# Patient Record
Sex: Male | Born: 1965 | Hispanic: Yes | Marital: Single | Smoking: Never smoker
Health system: Southern US, Community
[De-identification: ages and names within clinical notes are randomized; demographics above are authoritative.]

## PROBLEM LIST (undated history)

## (undated) HISTORY — PX: FINGER SURGERY: SHX640

---

## 2008-10-12 ENCOUNTER — Encounter: Admission: RE | Admit: 2008-10-12 | Discharge: 2008-10-12 | Payer: Self-pay | Admitting: Neurology

## 2009-12-15 IMAGING — CT CT HEAD WO/W CM
1 of 2 series · 13 of 30 positions shown, 17 images · IV contrast (75CC OMNI 300)
Comparison: None

CLINICAL DATA: Vertigo.  Headaches.  Sinusitis.

CT HEAD WITHOUT AND WITH CONTRAST
TECHNIQUE: Contiguous axial images were obtained from the base of
the skull through the vertex without and with intravenous contrast.
Contrast: 75 ml Xmnipaque-TBB IV

[Series 32: 3d filtered head · axial · 0.49mm/px · z∈[+3,+135]mm · 13 of 32 slices shown, 17 images]
[im 3/32  brain]
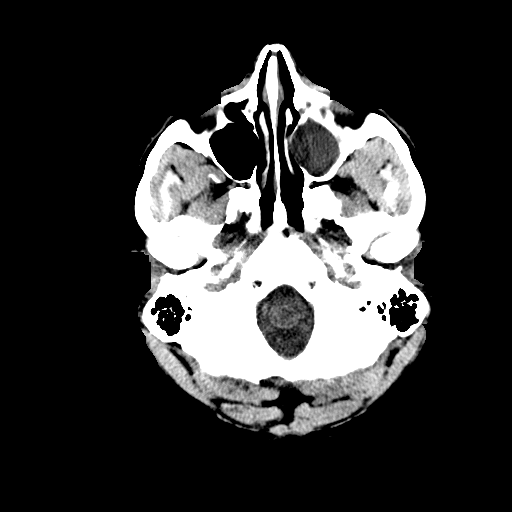
[im 3/32  bone]
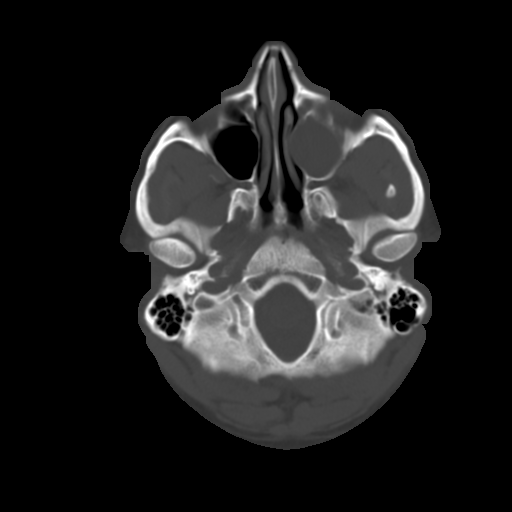
[im 5/32  brain]
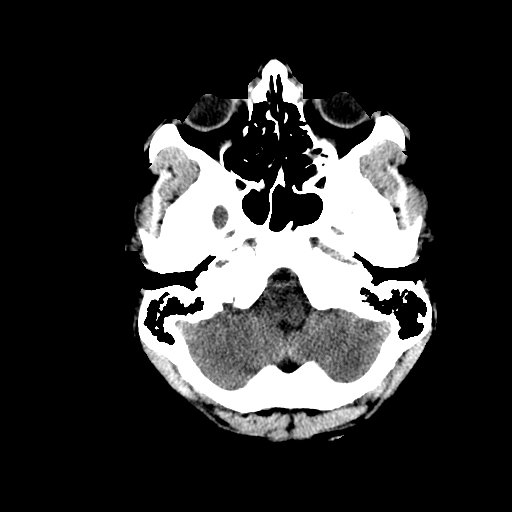
[im 7/32  brain]
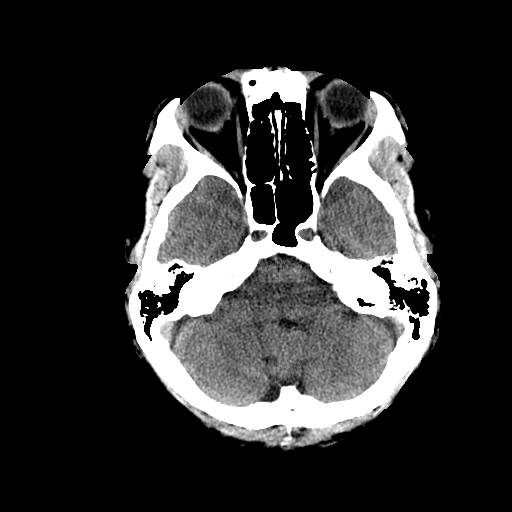
[im 9/32  brain]
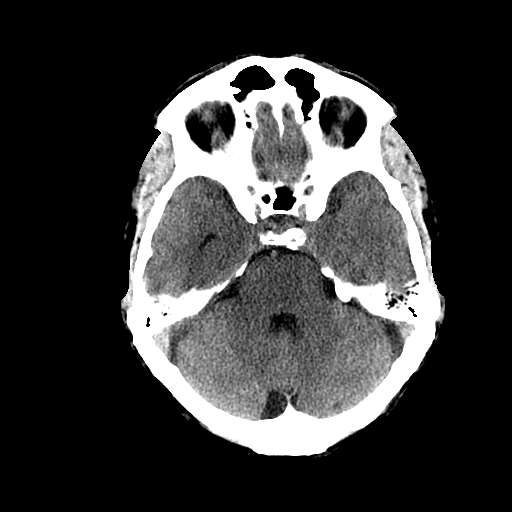
[im 12/32  brain]
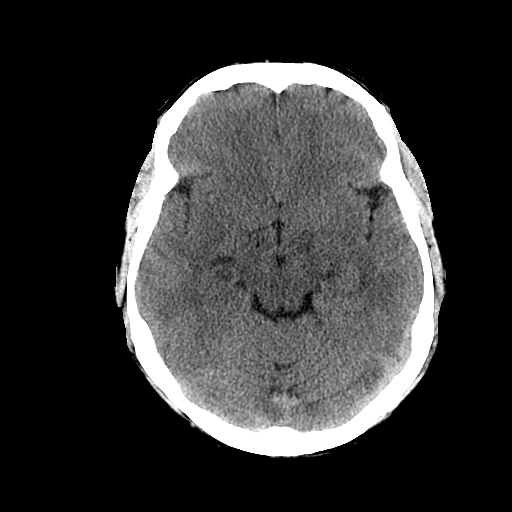
[im 12/32  bone]
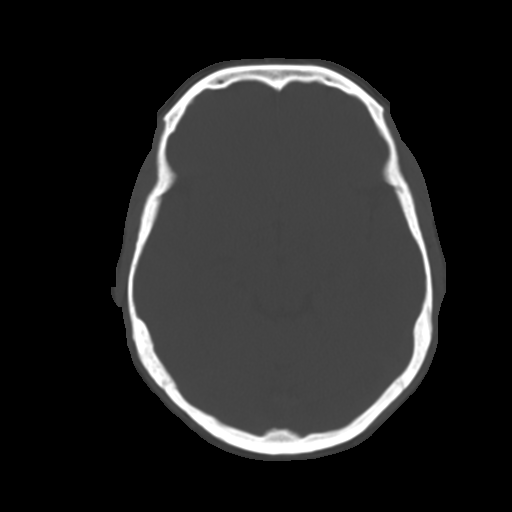
[im 14/32  brain]
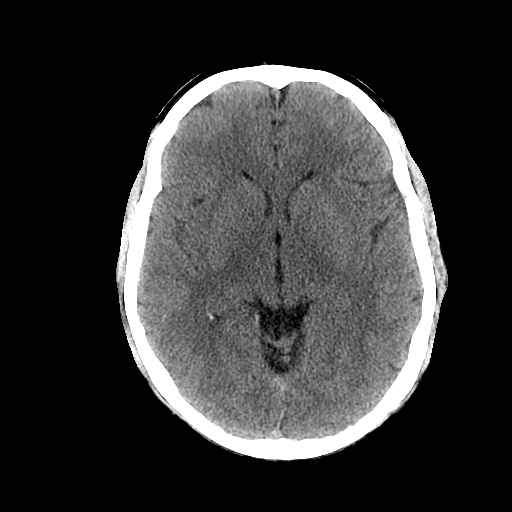
[im 16/32  brain]
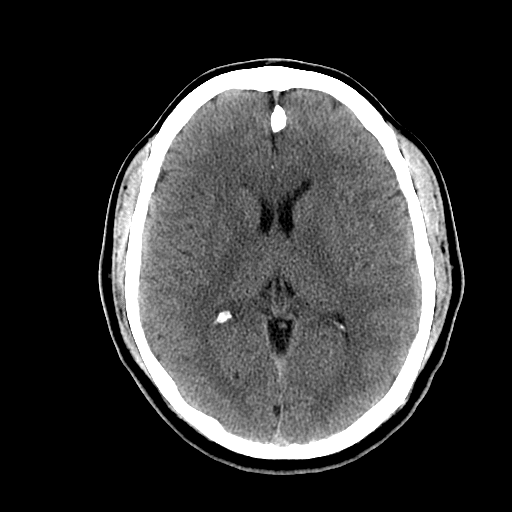
[im 18/32  brain]
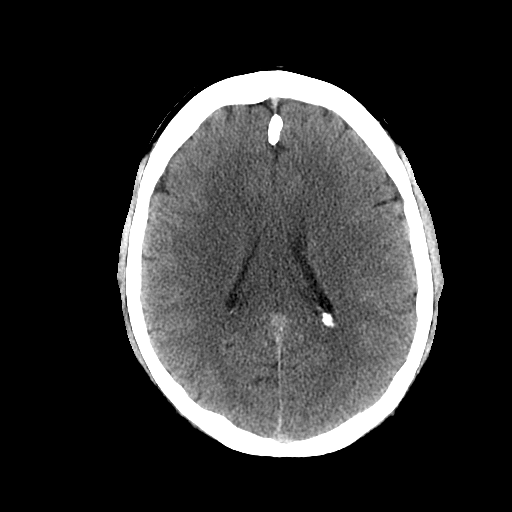
[im 20/32  brain]
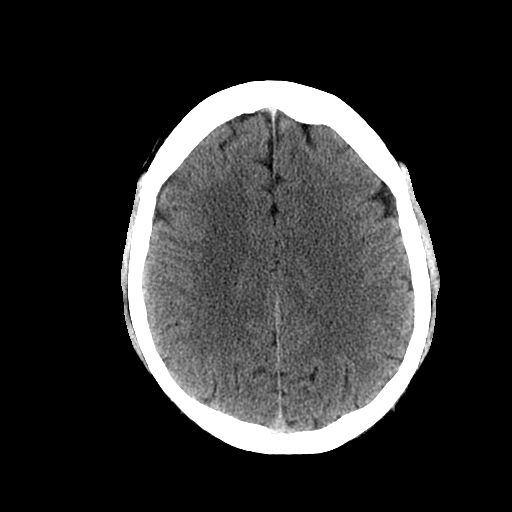
[im 20/32  bone]
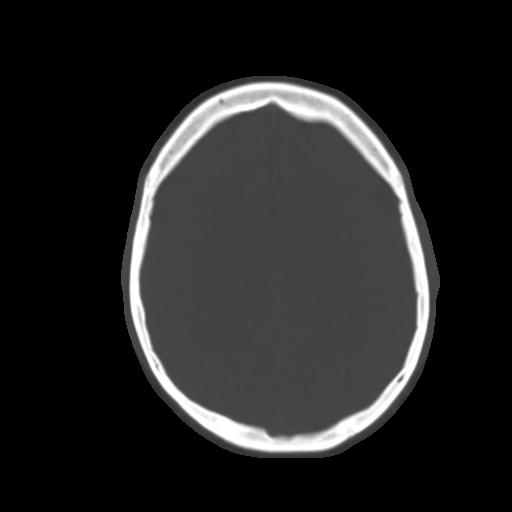
[im 23/32  brain]
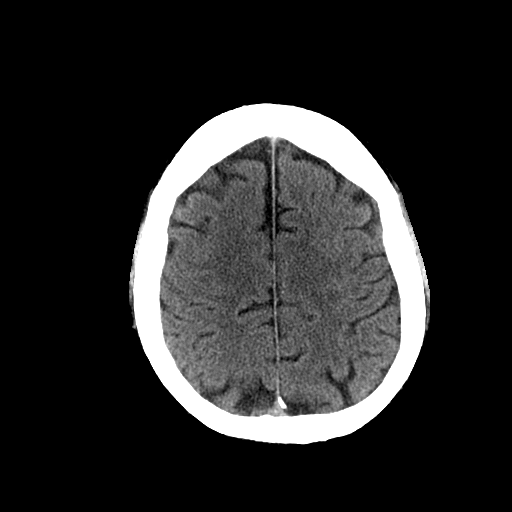
[im 25/32  brain]
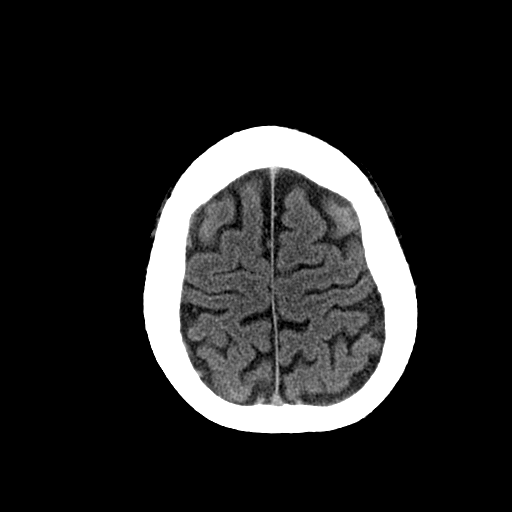
[im 27/32  brain]
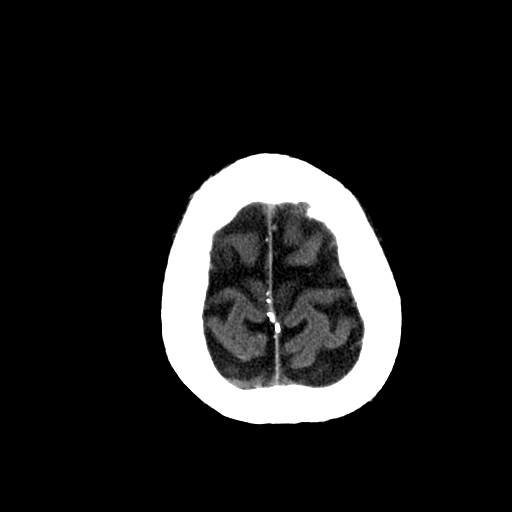
[im 29/32  brain]
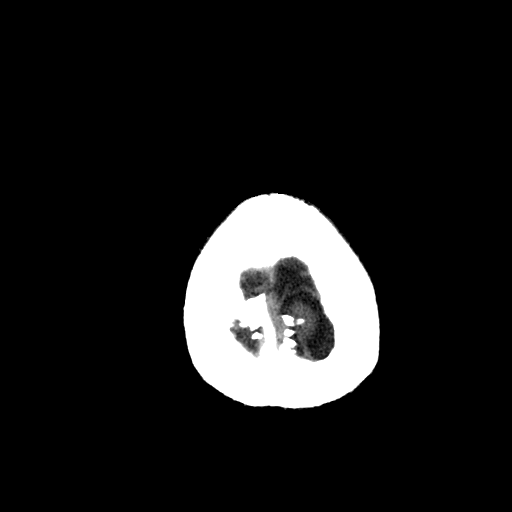
[im 29/32  bone]
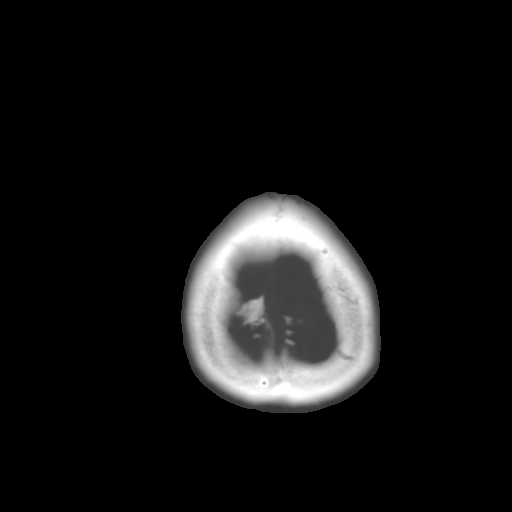

[13 of 30 positions shown; findings below may reference images not displayed]

FINDINGS: No acute intracranial abnormality.  Negative for mass
lesion, hemorrhage, infarction, or hydrocephalus.  No abnormal
contrast enhancement.  There is total opacification of the left
maxillary sinus.
IMPRESSION: No acute intracranial abnormality.  Left maxillary sinusitis.

## 2012-04-21 ENCOUNTER — Other Ambulatory Visit: Payer: Self-pay | Admitting: *Deleted

## 2012-04-21 DIAGNOSIS — H547 Unspecified visual loss: Secondary | ICD-10-CM

## 2012-04-26 ENCOUNTER — Other Ambulatory Visit: Payer: Self-pay

## 2022-11-29 ENCOUNTER — Emergency Department (HOSPITAL_BASED_OUTPATIENT_CLINIC_OR_DEPARTMENT_OTHER): Payer: BC Managed Care – PPO

## 2022-11-29 ENCOUNTER — Other Ambulatory Visit: Payer: Self-pay

## 2022-11-29 ENCOUNTER — Emergency Department (HOSPITAL_BASED_OUTPATIENT_CLINIC_OR_DEPARTMENT_OTHER)
Admission: EM | Admit: 2022-11-29 | Discharge: 2022-11-29 | Disposition: A | Discharge status: Home / Self Care | Payer: BC Managed Care – PPO | Attending: Emergency Medicine | Admitting: Emergency Medicine

## 2022-11-29 ENCOUNTER — Encounter (HOSPITAL_BASED_OUTPATIENT_CLINIC_OR_DEPARTMENT_OTHER): Payer: Self-pay | Admitting: Emergency Medicine

## 2022-11-29 DIAGNOSIS — R112 Nausea with vomiting, unspecified: Secondary | ICD-10-CM | POA: Insufficient documentation

## 2022-11-29 DIAGNOSIS — R1013 Epigastric pain: Secondary | ICD-10-CM

## 2022-11-29 LAB — COMPREHENSIVE METABOLIC PANEL
ALT: 30 U/L (ref 0–44)
AST: 33 U/L (ref 15–41)
Albumin: 4.3 g/dL (ref 3.5–5.0)
Alkaline Phosphatase: 91 U/L (ref 38–126)
Anion gap: 10 (ref 5–15)
BUN: 14 mg/dL (ref 6–20)
CO2: 24 mmol/L (ref 22–32)
Calcium: 8.6 mg/dL — ABNORMAL LOW (ref 8.9–10.3)
Chloride: 104 mmol/L (ref 98–111)
Creatinine, Ser: 1.02 mg/dL (ref 0.61–1.24)
GFR, Estimated: >60 mL/min (ref >60)
Glucose, Bld: 137 mg/dL — ABNORMAL HIGH (ref 70–99)
Potassium: 3.7 mmol/L (ref 3.5–5.1)
Sodium: 138 mmol/L (ref 135–145)
Total Bilirubin: 1.2 mg/dL (ref 0.3–1.2)
Total Protein: 7.8 g/dL (ref 6.5–8.1)

## 2022-11-29 LAB — CBC WITH DIFFERENTIAL/PLATELET
Abs Immature Granulocytes: 0.05 10*3/uL (ref 0.00–0.07)
Basophils Absolute: 0 10*3/uL (ref 0.0–0.1)
Basophils Relative: 0 %
Eosinophils Absolute: 0 10*3/uL (ref 0.0–0.5)
Eosinophils Relative: 0 %
HCT: 47.9 % (ref 39.0–52.0)
Hemoglobin: 16.3 g/dL (ref 13.0–17.0)
Immature Granulocytes: 0 %
Lymphocytes Relative: 11 %
Lymphs Abs: 1.4 10*3/uL (ref 0.7–4.0)
MCH: 30.6 pg (ref 26.0–34.0)
MCHC: 34 g/dL (ref 30.0–36.0)
MCV: 89.9 fL (ref 80.0–100.0)
Monocytes Absolute: 0.4 10*3/uL (ref 0.1–1.0)
Monocytes Relative: 3 %
Neutro Abs: 10.6 10*3/uL — ABNORMAL HIGH (ref 1.7–7.7)
Neutrophils Relative %: 86 %
Platelets: 321 10*3/uL (ref 150–400)
RBC: 5.33 MIL/uL (ref 4.22–5.81)
RDW: 12.7 % (ref 11.5–15.5)
WBC: 12.5 10*3/uL — ABNORMAL HIGH (ref 4.0–10.5)
nRBC: 0 % (ref 0.0–0.2)

## 2022-11-29 LAB — TROPONIN I (HIGH SENSITIVITY)
Troponin I (High Sensitivity): 3 ng/L (ref <18)
Troponin I (High Sensitivity): 3 ng/L (ref <18)

## 2022-11-29 LAB — LIPASE, BLOOD: Lipase: 23 U/L (ref 11–51)

## 2022-11-29 MED ORDER — SODIUM CHLORIDE 0.9 % IV BOLUS
1000.0000 mL | Freq: Once | INTRAVENOUS | Status: AC
  Administered 2022-11-29: 1000 mL via INTRAVENOUS

## 2022-11-29 MED ORDER — PANTOPRAZOLE SODIUM 40 MG PO TBEC: 40.0000 mg | DELAYED_RELEASE_TABLET | Freq: Every day | ORAL | 0 refills | Status: AC

## 2022-11-29 MED ORDER — LIDOCAINE VISCOUS HCL 2 % MT SOLN
15.0000 mL | Freq: Once | OROMUCOSAL | Status: AC
  Administered 2022-11-29: 15 mL via ORAL
  Filled 2022-11-29: qty 15

## 2022-11-29 MED ORDER — HYDROMORPHONE HCL 1 MG/ML IJ SOLN
1.0000 mg | Freq: Once | INTRAMUSCULAR | Status: AC
  Administered 2022-11-29: 1 mg via INTRAVENOUS
  Filled 2022-11-29: qty 1

## 2022-11-29 MED ORDER — ONDANSETRON HCL 4 MG/2ML IJ SOLN
4.0000 mg | Freq: Once | INTRAMUSCULAR | Status: AC
  Administered 2022-11-29: 4 mg via INTRAVENOUS
  Filled 2022-11-29: qty 2

## 2022-11-29 MED ORDER — ALUM & MAG HYDROXIDE-SIMETH 200-200-20 MG/5ML PO SUSP
30.0000 mL | Freq: Once | ORAL | Status: AC
  Administered 2022-11-29: 30 mL via ORAL
  Filled 2022-11-29: qty 30

## 2022-11-29 MED ORDER — IOHEXOL 300 MG/ML  SOLN
100.0000 mL | Freq: Once | INTRAMUSCULAR | Status: AC | PRN
  Administered 2022-11-29: 100 mL via INTRAVENOUS

## 2022-11-29 MED ORDER — PANTOPRAZOLE SODIUM 40 MG IV SOLR
40.0000 mg | Freq: Once | INTRAVENOUS | Status: AC
  Administered 2022-11-29: 40 mg via INTRAVENOUS
  Filled 2022-11-29: qty 10

## 2022-11-29 NOTE — Discharge Instructions (Addendum)
Your evaluated in the emergency department today for abdominal pain, nausea, vomiting.  Your imaging today was overall indicated gallstones, without evidence of obstruction or acute infection.  Is important to follow-up with a gastroenterologist for further evaluation and continued medical management.  You have been provided the contact information for [facility] gastroenterology, please call tomorrow morning to schedule an appointment within the next 1 week.  A prescription has been provided for you by the name of Protonix.  Take daily for the next 3 weeks, or until instructed otherwise by gastroenterology.  You have also been provided the contact information for to local primary care offices.  If you do not have a PCP, please call to schedule an appointment to establish care and for cohesive management.  Return to the ED for new or worsening symptoms as discussed.

## 2022-11-29 NOTE — ED Provider Notes (Signed)
[facility] [place] [facility] [facility] Provider Note   CSN: [id number] Arrival date & time: 11/29/22  1040     History  Chief Complaint  Patient presents with   Abdominal Pain    Blake Simpson is a 57 y.o. male with Hx of migraines presenting to the ED with acute upper abdominal pain.  Started around 2:30 AM today, woke patient up out of his sleep.  Started mildly and has continued to increase until about 530, stayed consistent since then.  Rated as 7/10, described as sharp and feeling like he is being punched.  No radiation through to the back or from the site.  Endorses nausea and 1-2 episodes of vomiting, no blood in the vomit.  Denies constipation or diarrhea.  No recent injury.  Reports having symptoms like this about 6 months ago, was told this was likely acid reflux.  However patient states today's pain is much worse than then.  Social alcohol use about once a month.  Admits to chronic NSAID use, 3-4 times per week ibuprofen, and 3-4 times per week Excedrin for migraines.  The history is provided by the patient and medical records.  Abdominal Pain Associated symptoms: nausea and vomiting      Home Medications Prior to Admission medications   Medication Sig Start Date End Date Taking? Authorizing Provider  pantoprazole (PROTONIX) 40 MG tablet Take 1 tablet (40 mg total) by mouth daily for 21 days. 11/29/22 12/20/22 Yes Prince Rome, PA-C      Allergies    Patient has no known allergies.    Review of Systems   Review of Systems  Gastrointestinal:  Positive for abdominal pain, nausea and vomiting.    Physical Exam Updated Vital Signs BP 123/74 (BP Location: Right Arm)   Pulse 66   Temp 98.6 F (37 C) (Oral)   Resp 20   Ht 5\' 10"  (1.778 m)   Wt 115.7 kg   SpO2 99%   BMI 36.59 kg/m  Physical Exam Vitals and nursing note reviewed.  Constitutional:      General: He is not in acute distress.    Appearance: He is well-developed. He is  not ill-appearing, toxic-appearing or diaphoretic.     Comments: Uncomfortable appearing  HENT:     Head: Normocephalic and atraumatic.  Eyes:     Conjunctiva/sclera: Conjunctivae normal.  Cardiovascular:     Rate and Rhythm: Normal rate and regular rhythm.     Heart sounds: Normal heart sounds. No murmur heard. Pulmonary:     Effort: Pulmonary effort is normal. No respiratory distress.     Breath sounds: Normal breath sounds. No wheezing.     Comments: CTAB, able to communicate without difficulty, without increased respiratory effort.  Equal chest rise.  Adequate air movement Chest:     Chest wall: No tenderness.  Abdominal:     General: Abdomen is protuberant. Bowel sounds are normal. There is distension (Mild).     Palpations: Abdomen is soft. There is no shifting dullness, mass or pulsatile mass.     Tenderness: There is abdominal tenderness in the right upper quadrant, epigastric area and left upper quadrant. There is no right CVA tenderness, left CVA tenderness or guarding. Positive signs include Murphy's sign. Negative signs include McBurney's sign.  Musculoskeletal:        General: No swelling.     Cervical back: Neck supple.  Skin:    General: Skin is warm and dry.     Capillary Refill:  Capillary refill takes less than 2 seconds.     Coloration: Skin is not cyanotic, jaundiced or pale.     Comments: Cool  Neurological:     Mental Status: He is alert and oriented to person, place, and time.     GCS: GCS eye subscore is 4. GCS verbal subscore is 5. GCS motor subscore is 6.  Psychiatric:        Mood and Affect: Mood normal.     ED Results / Procedures / Treatments   Labs (all labs ordered are listed, but only abnormal results are displayed) Labs Reviewed  CBC WITH DIFFERENTIAL/PLATELET - Abnormal; Notable for the following components:      Result Value   WBC 12.5 (*)    Neutro Abs 10.6 (*)    All other components within normal limits  COMPREHENSIVE METABOLIC PANEL -  Abnormal; Notable for the following components:   Glucose, Bld 137 (*)    Calcium 8.6 (*)    All other components within normal limits  LIPASE, BLOOD  TROPONIN I (HIGH SENSITIVITY)  TROPONIN I (HIGH SENSITIVITY)    EKG EKG Interpretation  Date/Time:  Sunday November 29 2022 10:59:02 EST Ventricular Rate:  65 PR Interval:  144 QRS Duration: 103 QT Interval:  426 QTC Calculation: 443 R Axis:   85 Text Interpretation: Sinus rhythm No old tracing to compare Confirmed by Meridee Score [phone]) on 11/29/2022 11:05:28 AM  Radiology US Abdomen Limited RUQ (LIVER/GB)  Result Date: 11/29/2022 CLINICAL DATA:  Increased abdominal pain. EXAM: ULTRASOUND ABDOMEN LIMITED RIGHT UPPER QUADRANT COMPARISON:  CT of the abdomen 11/29/2022 FINDINGS: Gallbladder: No gallbladder wall thickening visualized. No sonographic Murphy sign noted by sonographer. There is cholelithiasis. Common bile duct: Diameter: 6.4 mm Liver: No focal lesion identified. Within normal limits in parenchymal echogenicity. Portal vein is patent on color Doppler imaging with normal direction of blood flow towards the liver. Other: None. IMPRESSION: Cholelithiasis without evidence of acute cholecystitis. Electronically Signed   By: Ted Mcalpine M.D.   On: 11/29/2022 15:09   CT Abdomen Pelvis W Contrast  Result Date: 11/29/2022 CLINICAL DATA:  Upper abdominal pain with vomiting. EXAM: CT ABDOMEN AND PELVIS WITH CONTRAST TECHNIQUE: Multidetector CT imaging of the abdomen and pelvis was performed using the standard protocol following bolus administration of intravenous contrast. RADIATION DOSE REDUCTION: This exam was performed according to the departmental dose-optimization program which includes automated exposure control, adjustment of the mA and/or kV according to patient size and/or use of iterative reconstruction technique. CONTRAST:  OMNIPAQUE IOHEXOL 300 MG/ML  SOLN COMPARISON:  None Available. FINDINGS: Lower chest:  Unremarkable. Hepatobiliary: No suspicious focal abnormality within the liver parenchyma. Multiple noncalcified cholesterol stones are seen in the gallbladder. No intrahepatic or extrahepatic biliary dilation. Pancreas: No focal mass lesion. No dilatation of the main duct. No intraparenchymal cyst. No peripancreatic edema. Spleen: No splenomegaly. No focal mass lesion. Adrenals/Urinary Tract: No adrenal nodule or mass. Kidneys unremarkable. Tiny hypoattenuating lesions in both kidneys are too small to characterize but most likely benign. No followup imaging is recommended. No evidence for hydroureter. The urinary bladder appears normal for the degree of distention. Stomach/Bowel: Stomach is unremarkable. No gastric wall thickening. No evidence of outlet obstruction. Duodenum is normally positioned as is the ligament of Treitz. No small bowel wall thickening. No small bowel dilatation. The terminal ileum is normal. The appendix is not well visualized, but there is no edema or inflammation in the region of the cecum. No gross colonic mass. No  colonic wall thickening. Diverticular changes are noted in the left colon without evidence of diverticulitis. Vascular/Lymphatic: No abdominal aortic aneurysm. No abdominal aortic atherosclerotic calcification. There is no gastrohepatic or hepatoduodenal ligament lymphadenopathy. No retroperitoneal or mesenteric lymphadenopathy. No pelvic sidewall lymphadenopathy. Reproductive: The prostate gland and seminal vesicles are unremarkable. Other: No intraperitoneal free fluid. Musculoskeletal: No worrisome lytic or sclerotic osseous abnormality. IMPRESSION: 1. No acute findings in the abdomen or pelvis. 2. Uncomplicated cholelithiasis. 3. Left colonic diverticulosis without diverticulitis. Electronically Signed   By: Misty Stanley M.D.   On: 11/29/2022 12:55   DG Chest 2 View  Result Date: 11/29/2022 CLINICAL DATA:  Epigastric pain EXAM: CHEST - 2 VIEW COMPARISON:  None Available.  FINDINGS: No pleural effusion. No pneumothorax. Normal cardiac and mediastinal contours. No focal airspace opacity. No radiographically apparent displaced rib fractures. Visualized upper abdomen is unremarkable. IMPRESSION: No active cardiopulmonary disease. Electronically Signed   By: Marin Roberts M.D.   On: 11/29/2022 11:35    Procedures Procedures    Medications Ordered in ED Medications  sodium chloride 0.9 % bolus 1,000 mL (0 mLs Intravenous Stopped 11/29/22 1241)  ondansetron (ZOFRAN) injection 4 mg (4 mg Intravenous Given 11/29/22 1120)  HYDROmorphone (DILAUDID) injection 1 mg (1 mg Intravenous Given 11/29/22 1120)  alum & mag hydroxide-simeth (MAALOX/MYLANTA) 200-200-20 MG/5ML suspension 30 mL (30 mLs Oral Given 11/29/22 1323)    And  lidocaine (XYLOCAINE) 2 % viscous mouth solution 15 mL (15 mLs Oral Given 11/29/22 1323)  pantoprazole (PROTONIX) injection 40 mg (40 mg Intravenous Given 11/29/22 1321)  iohexol (OMNIPAQUE) 300 MG/ML solution 100 mL (100 mLs Intravenous Contrast Given 11/29/22 1210)    ED Course/ Medical Decision Making/ A&P                             Medical Decision Making Amount and/or Complexity of Data Reviewed Labs: ordered. Radiology: ordered.  Risk OTC drugs. Prescription drug management.   57 y.o. male presents to the ED for concern of Abdominal Pain   This involves an extensive number of treatment options, and is a complaint that carries with it a high risk of complications and morbidity.  The emergent differential diagnosis prior to evaluation includes, but is not limited to: perforated ulcer or bowel, GERD, PUD, acute biliary pathology/disease, pancreatitis, ACS  This is not an exhaustive differential.   Past Medical History / Co-morbidities / Social History: Hx of social alcohol use (1/mo) and frequent NSAID/excedrin use for migraines. Social Determinants of Health include: None  Additional History:  None  Lab Tests: I ordered, and personally  interpreted labs.  The pertinent results include:   Mildly elevated WBC 12.5, nonspecific may be related to nausea/vomiting Calcium 8.6, nonspecific.  No other electrolyte derangement.  Glucose 137.  LFTs WNL, no AKI Troponin 3 Lipase 23  Imaging Studies: I ordered imaging studies including CXR, CT abd/p.   I independently visualized and interpreted imaging which showed  CXR: No free air, negative for acute cardiopulmonary pathology CT abdomen and pelvis:  I agree with the radiologist interpretation. I personally ordered and interpreted EKG 12 lead findings, which showed: NSR without significant ST changes, abnormal intervals, or dysrhythmias  ED Course: Pt uncomfortable appearing on exam.  Presenting with 8 to 10 hours of abdominal pain, mainly located in the epigastric region but also RUQ and RLQ.  With nausea and 1-2 episodes of nonbloody vomiting.  Hx of similar episode about 6 months ago, was  evaluated via televisit, informed this was likely GERD related.  Patient admits to long history of NSAID/Excedrin use for migraine management. Patient is nontoxic, nonseptic appearing, in no apparent distress.  Patient's pain and other symptoms adequately managed in ED.  Fluid bolus, zofran given.  Satting at 100% on room air.  Mildly tachypneic.  Labs, history and vitals reviewed.  EKG and troponin unremarkable. CXR negative for free air or acute cardiopulmonary pathology.  Low suspicion for ACS.  Patient does not meet the SIRS or Sepsis criteria.  Plan for CT abd/p and reassess. On repeat exam patient does not have a surgical abdomen and there are no peritoneal signs.  Mild-moderate improvement appreciated.  Imaging shows cholelithiasis though without indication of appendicitis, bowel obstruction, bowel perforation, renal calculi, cholecystitis, pancreatitis, pyelonephritis, diverticulitis.  Plan to provide GI cocktail and reassess Upon re-evaluation, pt still with some abdominal discomfort.  Plan to  further assess with RUQ [place] for possible acute cholecystitis. RUQ [place] indicates cholelithiasis as seen on CT without evidence of acute cholecystitis.  Negative Murphy's sonographic sign.   Overall, I am uncertain the exact etiology of the patient's symptoms.  However, I do not believe he is currently experiencing a medical, surgical, or psychiatric emergency.  Suspicious for GERD/PUD though again no evidence of free air or ulceration on imaging.  Pt has remained HDS in NAD.  Recommend close follow up with GI for re-evaluation and continued management.  Shared decision making with pt, who is in agreement.  GI resources provided.  Continued conservative symptomatic management discussed.  Patient reports satisfaction with today's encounter.  Patient in NAD and in good condition at time of discharge.  Disposition: After consideration the patient's encounter today, I do not feel today's workup suggests an emergent condition requiring admission or immediate intervention beyond what has been performed at this time.  Safe for discharge; instructed to return immediately for worsening symptoms, change in symptoms or any other concerns.  I have reviewed the patients home medicines and have made adjustments as needed.  Discussed course of treatment with the patient, whom demonstrated understanding.  Patient in agreement and has no further questions.    I discussed this case with my attending physician Dr. Melina Copa, who agreed with the proposed treatment course.  Attending physician stated agreement with plan or made changes to plan which were implemented.    This chart was dictated using voice recognition software.  Despite best efforts to proofread, errors can occur which can change the documentation meaning.         Final Clinical Impression(s) / ED Diagnoses Final diagnoses:  Epigastric pain    Rx / DC Orders ED Discharge Orders          Ordered    pantoprazole (PROTONIX) 40 MG tablet  Daily         11/29/22 1351              Prince Rome, [place] 46/80/[birth year] 1902    Hayden Rasmussen, MD 11/30/22 [phone]

## 2022-11-29 NOTE — ED Notes (Signed)
Positive Murphy's sign/ Negative Kehr Sign

## 2022-11-29 NOTE — ED Notes (Signed)
Presents with epigastric area discomfort, facial grimacing noted, skin cool and moist, RR increased, appears ill in triage, pt placed in exam room 3, ECG obtained, ED PA at beside, examined and orders rec. IV established, placed on cont cardiac monitoring.

## 2022-11-29 NOTE — ED Notes (Signed)
Appears less restless, facial grimacing is intermittent now, closing eyes at times

## 2022-11-29 NOTE — ED Triage Notes (Signed)
Patient c/o upper abdominal pain at 3 am with 1 episode of vomiting. Pt had similar episode 6 months ago.
